# Patient Record
Sex: Male | Born: 1960 | Race: Black or African American | Hispanic: No | Marital: Married
Health system: Southern US, Community
[De-identification: ages and names within clinical notes are randomized; demographics above are authoritative.]

## PROBLEM LIST (undated history)

## (undated) DIAGNOSIS — I10 Essential (primary) hypertension: Secondary | ICD-10-CM

## (undated) DIAGNOSIS — E785 Hyperlipidemia, unspecified: Secondary | ICD-10-CM

## (undated) DIAGNOSIS — E119 Type 2 diabetes mellitus without complications: Secondary | ICD-10-CM

---

## 2019-07-03 ENCOUNTER — Other Ambulatory Visit: Payer: Self-pay

## 2019-07-03 ENCOUNTER — Emergency Department (HOSPITAL_COMMUNITY)
Admission: EM | Admit: 2019-07-03 | Discharge: 2019-07-04 | Disposition: A | Discharge status: Home / Self Care | Payer: 59 | Attending: Emergency Medicine | Admitting: Emergency Medicine

## 2019-07-03 DIAGNOSIS — I10 Essential (primary) hypertension: Secondary | ICD-10-CM | POA: Insufficient documentation

## 2019-07-03 DIAGNOSIS — Z20828 Contact with and (suspected) exposure to other viral communicable diseases: Secondary | ICD-10-CM | POA: Diagnosis not present

## 2019-07-03 DIAGNOSIS — Y92481 Parking lot as the place of occurrence of the external cause: Secondary | ICD-10-CM | POA: Diagnosis not present

## 2019-07-03 DIAGNOSIS — Z23 Encounter for immunization: Secondary | ICD-10-CM | POA: Diagnosis not present

## 2019-07-03 DIAGNOSIS — E119 Type 2 diabetes mellitus without complications: Secondary | ICD-10-CM | POA: Insufficient documentation

## 2019-07-03 DIAGNOSIS — Y999 Unspecified external cause status: Secondary | ICD-10-CM | POA: Insufficient documentation

## 2019-07-03 DIAGNOSIS — W010XXA Fall on same level from slipping, tripping and stumbling without subsequent striking against object, initial encounter: Secondary | ICD-10-CM | POA: Insufficient documentation

## 2019-07-03 DIAGNOSIS — Y939 Activity, unspecified: Secondary | ICD-10-CM | POA: Insufficient documentation

## 2019-07-03 DIAGNOSIS — S4992XA Unspecified injury of left shoulder and upper arm, initial encounter: Secondary | ICD-10-CM | POA: Diagnosis present

## 2019-07-03 DIAGNOSIS — S42322A Displaced transverse fracture of shaft of humerus, left arm, initial encounter for closed fracture: Secondary | ICD-10-CM | POA: Insufficient documentation

## 2019-07-03 HISTORY — DX: Hyperlipidemia, unspecified: E78.5

## 2019-07-03 HISTORY — DX: Essential (primary) hypertension: I10

## 2019-07-03 HISTORY — DX: Type 2 diabetes mellitus without complications: E11.9

## 2019-07-03 NOTE — ED Triage Notes (Signed)
Pt fell in parking lot on wet asphalt and landed on LUE. EMS reports obvious deformity and numbness to LUE. No LOC or pain in other location apart from LUE. EMS splinted extremity. Pulses present to lt radial pulse site

## 2019-07-04 ENCOUNTER — Encounter (HOSPITAL_COMMUNITY): Payer: Self-pay | Admitting: *Deleted

## 2019-07-04 ENCOUNTER — Emergency Department (HOSPITAL_COMMUNITY): Payer: 59

## 2019-07-04 LAB — CBC WITH DIFFERENTIAL/PLATELET
Abs Immature Granulocytes: 0.04 10*3/uL (ref 0.00–0.07)
Basophils Absolute: 0 10*3/uL (ref 0.0–0.1)
Basophils Relative: 0 %
Eosinophils Absolute: 0.2 10*3/uL (ref 0.0–0.5)
Eosinophils Relative: 2 %
HCT: 48.3 % (ref 39.0–52.0)
Hemoglobin: 15.9 g/dL (ref 13.0–17.0)
Immature Granulocytes: 0 %
Lymphocytes Relative: 16 %
Lymphs Abs: 1.6 10*3/uL (ref 0.7–4.0)
MCH: 28.5 pg (ref 26.0–34.0)
MCHC: 32.9 g/dL (ref 30.0–36.0)
MCV: 86.7 fL (ref 80.0–100.0)
Monocytes Absolute: 0.9 10*3/uL (ref 0.1–1.0)
Monocytes Relative: 9 %
Neutro Abs: 7.6 10*3/uL (ref 1.7–7.7)
Neutrophils Relative %: 73 %
Platelets: 106 10*3/uL — ABNORMAL LOW (ref 150–400)
RBC: 5.57 MIL/uL (ref 4.22–5.81)
RDW: 14.8 % (ref 11.5–15.5)
WBC: 10.3 10*3/uL (ref 4.0–10.5)
nRBC: 0 % (ref 0.0–0.2)

## 2019-07-04 LAB — BASIC METABOLIC PANEL
Anion gap: 10 (ref 5–15)
BUN: 14 mg/dL (ref 6–20)
CO2: 26 mmol/L (ref 22–32)
Calcium: 8.5 mg/dL — ABNORMAL LOW (ref 8.9–10.3)
Chloride: 104 mmol/L (ref 98–111)
Creatinine, Ser: 1.03 mg/dL (ref 0.61–1.24)
GFR calc Af Amer: >60 mL/min (ref >60)
GFR calc non Af Amer: >60 mL/min (ref >60)
Glucose, Bld: 125 mg/dL — ABNORMAL HIGH (ref 70–99)
Potassium: 4.4 mmol/L (ref 3.5–5.1)
Sodium: 140 mmol/L (ref 135–145)

## 2019-07-04 LAB — PROTIME-INR
INR: 0.9 (ref 0.8–1.2)
Prothrombin Time: 11.6 seconds (ref 11.4–15.2)

## 2019-07-04 LAB — TYPE AND SCREEN
ABO/RH(D): O POS
Antibody Screen: NEGATIVE

## 2019-07-04 LAB — ABO/RH: ABO/RH(D): O POS

## 2019-07-04 LAB — SARS CORONAVIRUS 2 BY RT PCR (HOSPITAL ORDER, PERFORMED IN ~~LOC~~ HOSPITAL LAB): SARS Coronavirus 2: NEGATIVE

## 2019-07-04 MED ORDER — FENTANYL CITRATE (PF) 100 MCG/2ML IJ SOLN
100.0000 ug | Freq: Once | INTRAMUSCULAR | Status: AC
  Administered 2019-07-04: 01:00:00 100 ug via INTRAVENOUS
  Filled 2019-07-04: qty 2

## 2019-07-04 MED ORDER — ONDANSETRON 8 MG PO TBDP: 8.0000 mg | ORAL_TABLET | Freq: Three times a day (TID) | ORAL | 0 refills | Status: AC | PRN

## 2019-07-04 MED ORDER — KETOROLAC TROMETHAMINE 30 MG/ML IJ SOLN
30.0000 mg | Freq: Once | INTRAMUSCULAR | Status: AC
  Administered 2019-07-04: 30 mg via INTRAVENOUS
  Filled 2019-07-04: qty 1

## 2019-07-04 MED ORDER — HYDROCODONE-ACETAMINOPHEN 5-325 MG PO TABS: 1.0000 | ORAL_TABLET | Freq: Four times a day (QID) | ORAL | 0 refills | Status: AC | PRN

## 2019-07-04 MED ORDER — TETANUS-DIPHTH-ACELL PERTUSSIS 5-2.5-18.5 LF-MCG/0.5 IM SUSP
0.5000 mL | Freq: Once | INTRAMUSCULAR | Status: AC
  Administered 2019-07-04: 01:00:00 0.5 mL via INTRAMUSCULAR
  Filled 2019-07-04: qty 0.5

## 2019-07-04 MED ORDER — FENTANYL CITRATE (PF) 100 MCG/2ML IJ SOLN
100.0000 ug | Freq: Once | INTRAMUSCULAR | Status: AC
  Administered 2019-07-04: 02:00:00 100 ug via INTRAVENOUS
  Filled 2019-07-04: qty 2

## 2019-07-04 NOTE — Discharge Instructions (Addendum)
You need to follow-up with an orthopedic/bone surgeon next week in [place] Please call your primary care doctor to assist with follow-up

## 2019-07-04 NOTE — ED Notes (Signed)
Pt ambulated self to restroom with staff supervision. No complications. Pt provided shirt and is states that he is ready for discharge. MD notified that pt has ambulated.

## 2019-07-04 NOTE — ED Provider Notes (Signed)
[facility]-EMERGENCY DEPT Provider Note   CSN: [id number] Arrival date & time: 07/03/19  2339     History   Chief Complaint Chief Complaint  Patient presents with  . Arm Pain    HPI Lawrence Ruiz is a 58 y.o. male.     The history is provided by the patient.  Arm Pain This is a new problem. The current episode started less than 1 hour ago. The problem occurs constantly. The problem has been rapidly worsening. Pertinent negatives include no chest pain, no abdominal pain and no headaches. The symptoms are aggravated by twisting and bending. Nothing relieves the symptoms. He has tried rest for the symptoms. The treatment provided no relief.  Patient history of diabetes and hypertension presents after fall.  He reports he fell on a wet parking lot landing on his left arm.  He may have hit his head, but denies any headache or LOC. He reports significant pain and deformity to his left arm No other acute complaints   Past Medical History:  Diagnosis Date  . Diabetes mellitus without complication (HCC)   . HLD (hyperlipidemia)   . Hypertension     There are no active problems to display for this patient.   History reviewed. No pertinent surgical history.      Home Medications    Prior to Admission medications   Not on File    Family History History reviewed. No pertinent family history.  Social History Social History   Tobacco Use  . Smoking status: Not on file  Substance Use Topics  . Alcohol use: Not on file  . Drug use: Not on file     Allergies   Patient has no known allergies.   Review of Systems Review of Systems  Constitutional: Negative for fever.  Respiratory: Negative for cough.   Cardiovascular: Negative for chest pain.  Gastrointestinal: Negative for abdominal pain.  Musculoskeletal: Positive for arthralgias. Negative for back pain and neck pain.  Neurological: Negative for headaches.  All other systems reviewed  and are negative.    Physical Exam Updated Vital Signs BP (!) 150/88 (BP Location: Left Arm)   Pulse 86   Temp 98.1 F (36.7 C) (Oral)   Resp 18   SpO2 97%   Physical Exam CONSTITUTIONAL: Well developed/well nourished, anxious HEAD: Normocephalic/atraumatic EYES: EOMI/PERRL ENMT: Mucous membranes moist NECK: supple no meningeal signs SPINE/BACK:entire spine nontender CV: S1/S2 noted, no murmurs/rubs/gallops noted LUNGS: Lungs are clear to auscultation bilaterally, no apparent distress Chest-no bruising or crepitus noted chest ABDOMEN: soft, nontender NEURO: Pt is awake/alert/appropriate, moves all extremitiesx4.  No facial droop.   EXTREMITIES: Obvious deformity noted to left humerus.  No lacerations noted.  No tenderness or deformity to left elbow, small abrasion noted elbow.  No deformity noted to left hand or wrist.  Distal pulses intact. All other extremities/joints palpated/ranged and nontender SKIN: warm, color normal PSYCH: Anxious  ED Treatments / Results  Labs (all labs ordered are listed, but only abnormal results are displayed) Labs Reviewed  BASIC METABOLIC PANEL - Abnormal; Notable for the following components:      Result Value   Glucose, Bld 125 (*)    Calcium 8.5 (*)    All other components within normal limits  CBC WITH DIFFERENTIAL/PLATELET - Abnormal; Notable for the following components:   Platelets 106 (*)    All other components within normal limits  SARS CORONAVIRUS 2 (HOSPITAL ORDER, PERFORMED IN [facility])  PROTIME-INR  TYPE AND  SCREEN  ABO/RH    EKG EKG Interpretation  Date/Time:  Saturday July 04 2019 00:47:49 EDT Ventricular Rate:  83 PR Interval:    QRS Duration: 103 QT Interval:  422 QTC Calculation: 496 R Axis:   21 Text Interpretation:  Sinus rhythm Multiple premature complexes, vent & supraven Probable left atrial enlargement Baseline wander in lead(s) V4 Abnormal ekg No previous ECGs available Confirmed by  Zadie RhineWickline, Rossie Scarfone ([id number]) on 07/04/2019 12:53:40 AM   Radiology Dg Chest 1 View  Result Date: 07/04/2019 CLINICAL DATA:  Pain after fall in parking lot landing on left upper extremity. EXAM: CHEST  1 VIEW COMPARISON:  None. FINDINGS: Heart is upper normal in size.The cardiomediastinal contours are normal. Pulmonary vasculature is normal. No consolidation, pleural effusion, or pneumothorax. No acute osseous abnormalities are seen. IMPRESSION: Upper normal heart size. No evidence of acute traumatic injury. Electronically Signed   By: Narda RutherfordMelanie  Sanford M.D.   On: 07/04/2019 01:04   Dg Forearm Left  Result Date: 07/04/2019 CLINICAL DATA:  Fall in parking lot landing on left arm. Left upper extremity pain. EXAM: LEFT FOREARM - 2 VIEW COMPARISON:  None. FINDINGS: Cortical margins of the radius and ulna are intact. There is no evidence of fracture or other focal bone lesions. Well corticated osseous density adjacent to the radial head appears chronic. Wrist and elbow alignment maintained. Soft tissues are unremarkable. IMPRESSION: Negative radiographs of the left forearm. Electronically Signed   By: Narda RutherfordMelanie  Sanford M.D.   On: 07/04/2019 01:03   Dg Humerus Left  Result Date: 07/04/2019 CLINICAL DATA:  Fall in parking lot landing on left upper extremity. EXAM: LEFT HUMERUS - 2+ VIEW COMPARISON:  None. FINDINGS: Transverse displaced mid humeral fracture. Mild apex medial angulation. Displacement of approximately 1/2 shaft width. Shoulder and elbow alignment are maintained. Well corticated density adjacent to the radial head appears chronic. IMPRESSION: Transverse displaced mid humeral shaft fracture. Electronically Signed   By: Narda RutherfordMelanie  Sanford M.D.   On: 07/04/2019 01:02    Procedures Procedures  SPLINT APPLICATION Date/Time: 2:58 AM Authorized by: Joya Gaskinsonald W Era Parr Consent: Verbal consent obtained. Risks and benefits: risks, benefits and alternatives were discussed Consent given by: patient Splint  applied by: orthopedic technician Location details: left upper extremity Splint type: coaptation, posterior Supplies used: ortho glass Post-procedure: The splinted body part was neurovascularly unchanged following the procedure. Patient tolerance: Patient tolerated the procedure well with no immediate complications.    Medications Ordered in ED Medications  fentaNYL (SUBLIMAZE) injection 100 mcg (100 mcg Intravenous Given 07/04/19 0045)  Tdap (BOOSTRIX) injection 0.5 mL (0.5 mLs Intramuscular Given 07/04/19 0045)  fentaNYL (SUBLIMAZE) injection 100 mcg (100 mcg Intravenous Given 07/04/19 0210)  ketorolac (TORADOL) 30 MG/ML injection 30 mg (30 mg Intravenous Given 07/04/19 0210)     Initial Impression / Assessment and Plan / ED Course  I have reviewed the triage vital signs and the nursing notes.  Pertinent labs & imaging results that were available during my care of the patient were reviewed by me and considered in my medical decision making (see chart for details).        12:32 AM Will obtain imaging and consult orthopedics.  This appears to be a closed fracture deformity. No signs of any head or neck injury. 1:28 AM Patient found to have midshaft humerus fracture.  Discussed the case with Dr. Ranell PatrickNorris with orthopedics.  He recommends coaptation splint with posterior long-arm splint He does not require immediate surgery. Patient has no evidence of any neurovascular  compromise full range of motion of left wrist. 1:46 AM Patient agreeable with plan.  He reports he is not from [place], he was dropping his daughter off to college at [employer].  He lives in [place].  He will be given the x-rays of his visit, and instructions to follow-up with orthopedics  group [place] 2:58 AM Patient feels improved after splint placement.  Distal pulses intact, full flexion-extension of left wrist, no sensory deficit. He is able to ambulate on his own.  No other acute complaints.  He  feels comfortable for discharge.  They plan to return to [facility] this weekend he will follow-up as an outpatient with orthopedics. Final Clinical Impressions(s) / ED Diagnoses   Final diagnoses:  Closed displaced transverse fracture of shaft of left humerus, initial encounter    ED Discharge Orders         Ordered    HYDROcodone-acetaminophen (NORCO/VICODIN) 5-325 MG tablet  Every 6 hours PRN     07/04/19 0243    ondansetron (ZOFRAN ODT) 8 MG disintegrating tablet  Every 8 hours PRN     07/04/19 0243           Ripley Fraise, MD 07/04/19 0300

## 2019-07-04 NOTE — ED Notes (Signed)
Patient transported to X-ray 

## 2019-07-04 NOTE — ED Notes (Signed)
Pt. IV removed, IV needle intact per RN,Kelly.

## 2019-07-04 NOTE — ED Notes (Signed)
Pt. Documented in error see note in chart. 

## 2019-07-04 NOTE — Progress Notes (Signed)
Orthopedic Tech Progress Note Patient Details:  Lawrence Ruiz 11-01-1961 [id number]  Ortho Devices Type of Ortho Device: Ace wrap, Long arm splint, Coapt Ortho Device/Splint Location: LUE Ortho Device/Splint Interventions: Ordered, Application   Post Interventions Patient Tolerated: Well Instructions Provided: Care of device   Braulio Bosch 07/04/2019, 2:25 AM

## 2020-05-25 IMAGING — CR LEFT FOREARM - 2 VIEW
2 series · 2 of 2 positions shown · non-contrast
Comparison: None.

CLINICAL DATA: Fall in parking lot landing on left arm. Left upper
extremity pain.

EXAM:
LEFT FOREARM - 2 VIEW

[x forearm ap left]
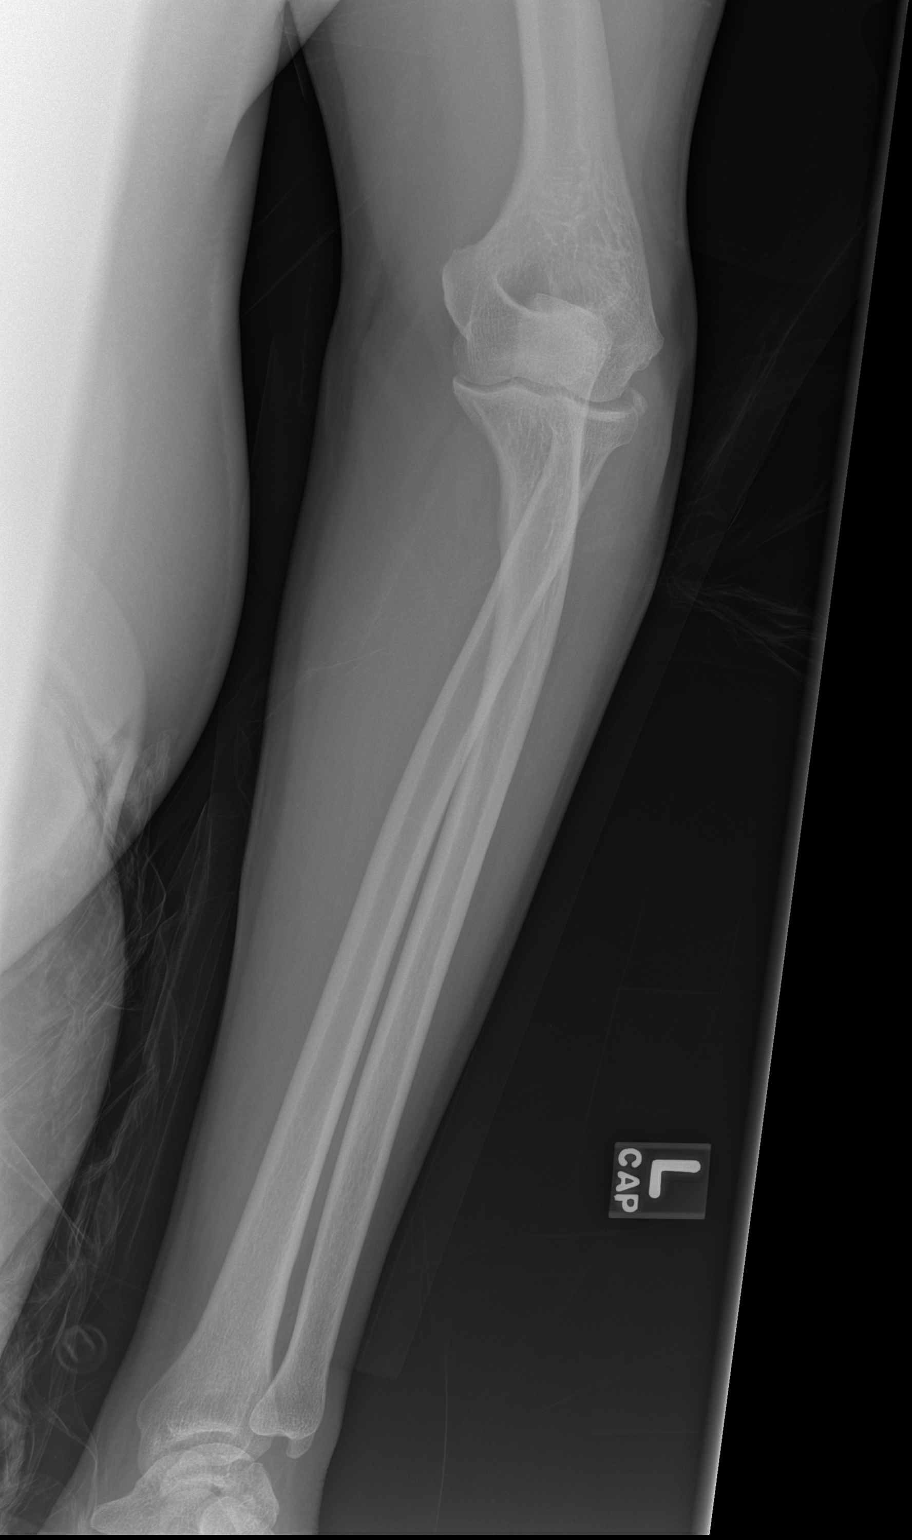

[x forearm lat left]
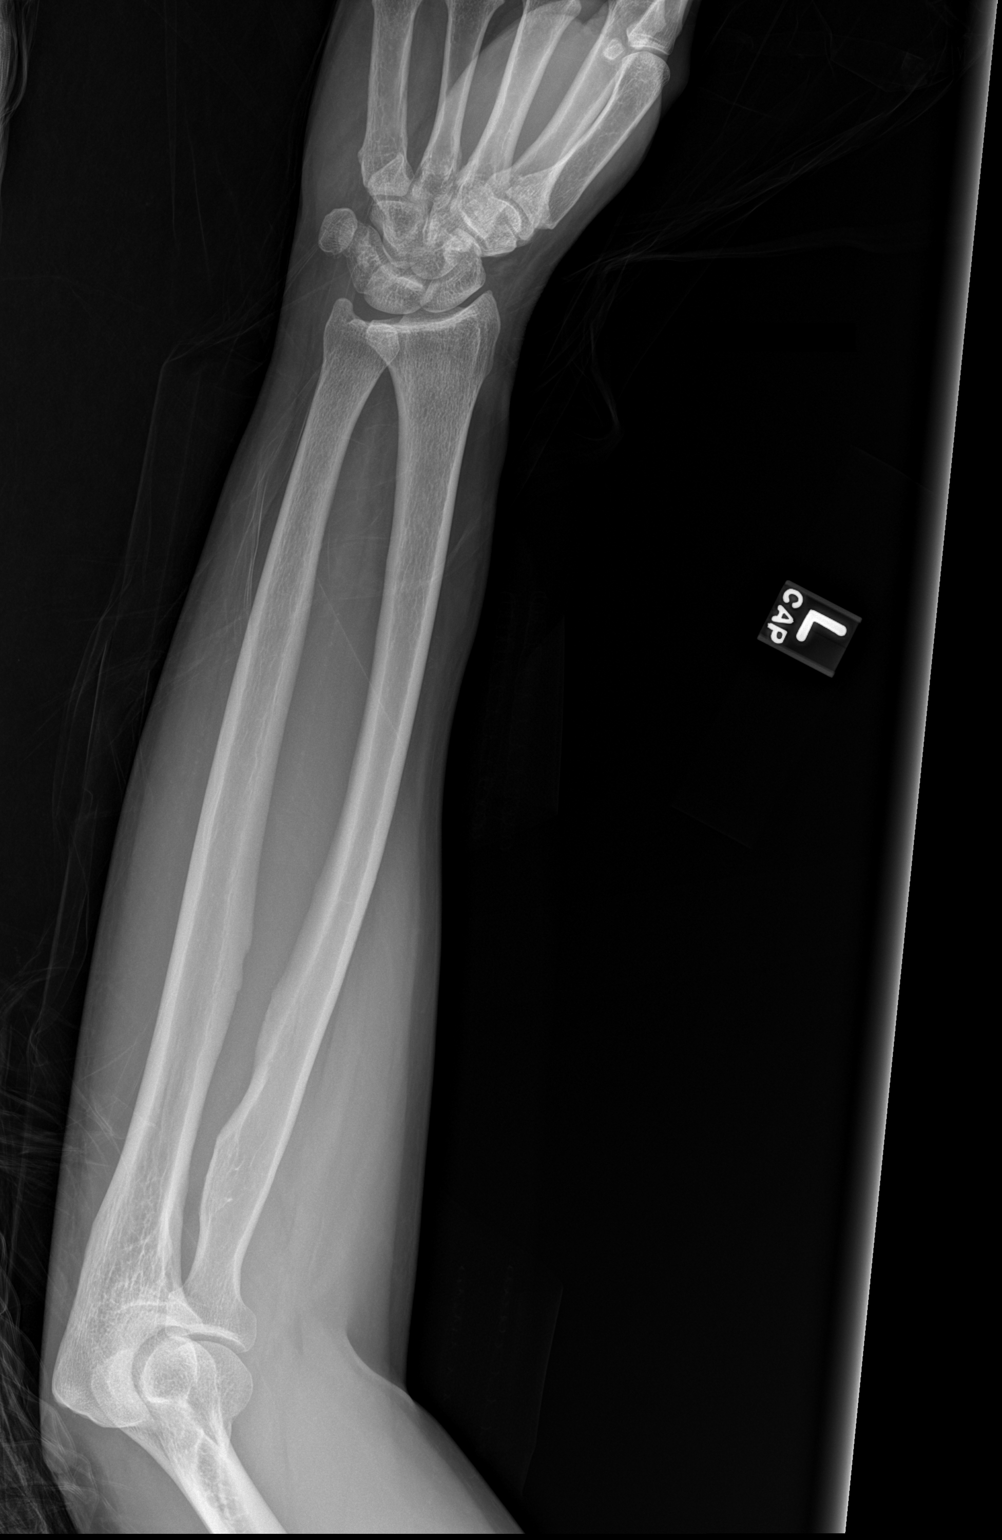

[2 of 2 positions shown; findings below may reference images not displayed]

FINDINGS: Cortical margins of the radius and ulna are intact. There is no
evidence of fracture or other focal bone lesions. Well corticated
osseous density adjacent to the radial head appears chronic. Wrist
and elbow alignment maintained. Soft tissues are unremarkable.
IMPRESSION: Negative radiographs of the left forearm.
